# Patient Record
Sex: Male | Born: 1970 | Race: Black or African American | Hispanic: No | Marital: Single | Smoking: Former smoker
Health system: Southern US, Community
[De-identification: ages and names within clinical notes are randomized; demographics above are authoritative.]

## PROBLEM LIST (undated history)

## (undated) DIAGNOSIS — F319 Bipolar disorder, unspecified: Secondary | ICD-10-CM

---

## 2002-06-07 ENCOUNTER — Emergency Department (HOSPITAL_COMMUNITY): Admission: EM | Admit: 2002-06-07 | Discharge: 2002-06-07 | Payer: Self-pay | Admitting: Emergency Medicine

## 2003-06-12 ENCOUNTER — Emergency Department (HOSPITAL_COMMUNITY): Admission: EM | Admit: 2003-06-12 | Discharge: 2003-06-12 | Payer: Self-pay

## 2010-08-23 ENCOUNTER — Emergency Department (HOSPITAL_COMMUNITY)
Admission: EM | Admit: 2010-08-23 | Discharge: 2010-08-23 | Disposition: A | Discharge status: Home / Self Care | Payer: Self-pay | Attending: Emergency Medicine | Admitting: Emergency Medicine

## 2010-08-23 DIAGNOSIS — M542 Cervicalgia: Secondary | ICD-10-CM | POA: Insufficient documentation

## 2010-08-26 ENCOUNTER — Emergency Department (HOSPITAL_COMMUNITY)
Admission: EM | Admit: 2010-08-26 | Discharge: 2010-08-26 | Disposition: A | Discharge status: Home / Self Care | Payer: Self-pay | Attending: Emergency Medicine | Admitting: Emergency Medicine

## 2010-08-26 ENCOUNTER — Emergency Department (HOSPITAL_COMMUNITY): Payer: Self-pay

## 2010-08-26 DIAGNOSIS — M62838 Other muscle spasm: Secondary | ICD-10-CM | POA: Insufficient documentation

## 2010-08-26 DIAGNOSIS — M5030 Other cervical disc degeneration, unspecified cervical region: Secondary | ICD-10-CM | POA: Insufficient documentation

## 2010-08-26 DIAGNOSIS — M25519 Pain in unspecified shoulder: Secondary | ICD-10-CM | POA: Insufficient documentation

## 2010-08-26 DIAGNOSIS — M542 Cervicalgia: Secondary | ICD-10-CM | POA: Insufficient documentation

## 2010-08-26 DIAGNOSIS — M47812 Spondylosis without myelopathy or radiculopathy, cervical region: Secondary | ICD-10-CM | POA: Insufficient documentation

## 2011-10-19 IMAGING — CR DG CERVICAL SPINE COMPLETE 4+V
8 series · 8 of 8 positions shown · non-contrast
Comparison: None.

CLINICAL DATA: Right-sided neck pain.  No known injury.

CERVICAL SPINE - COMPLETE 4+ VIEW

[w c-spine lat]
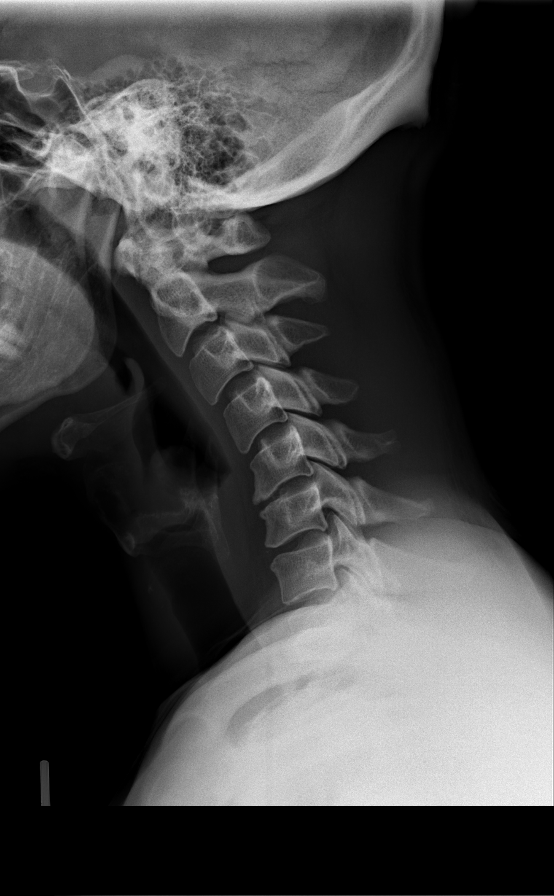

[w c-spine oblique (1 of 2)]
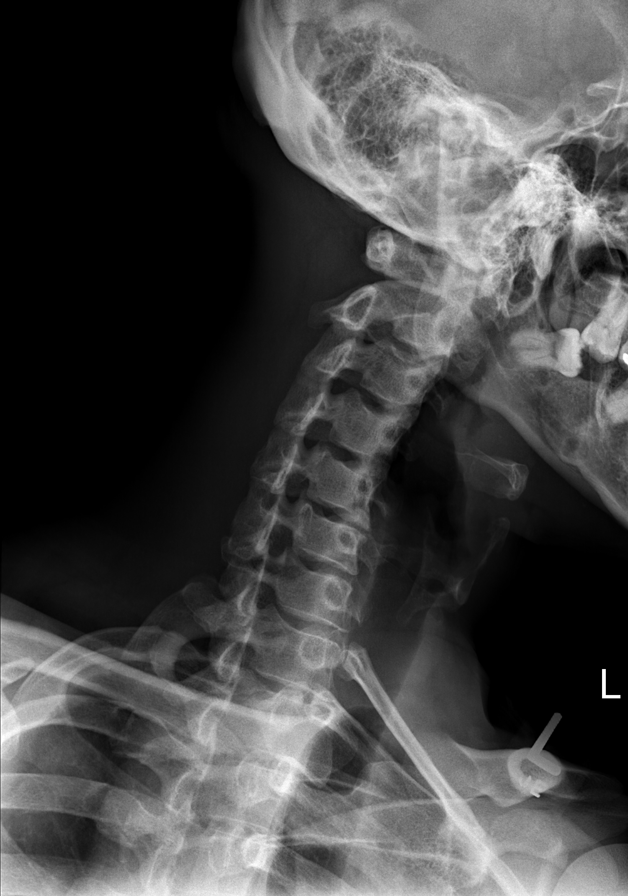

[w c-spine oblique (2 of 2)]
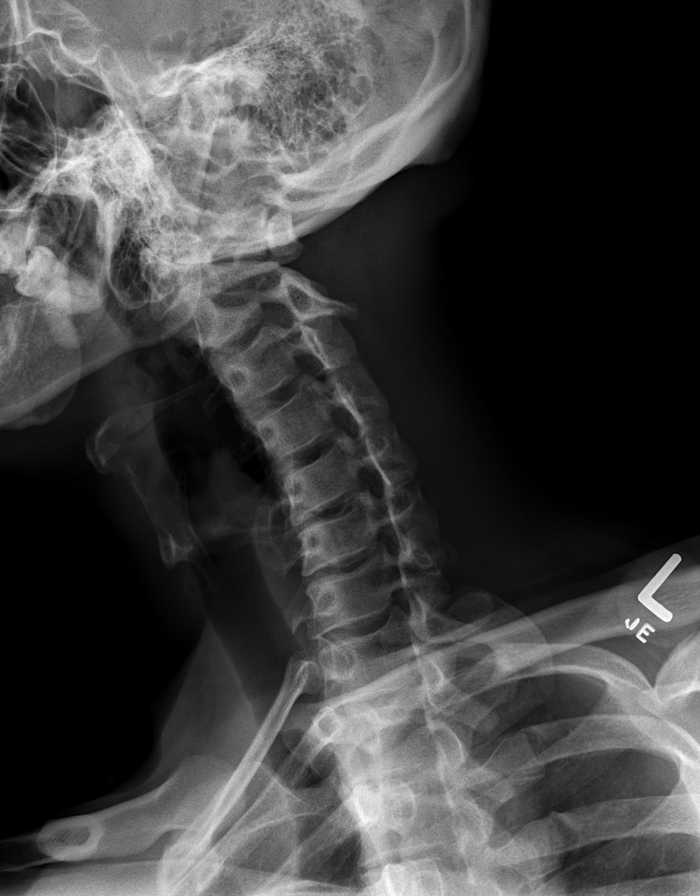

[w c-spine a.p.]
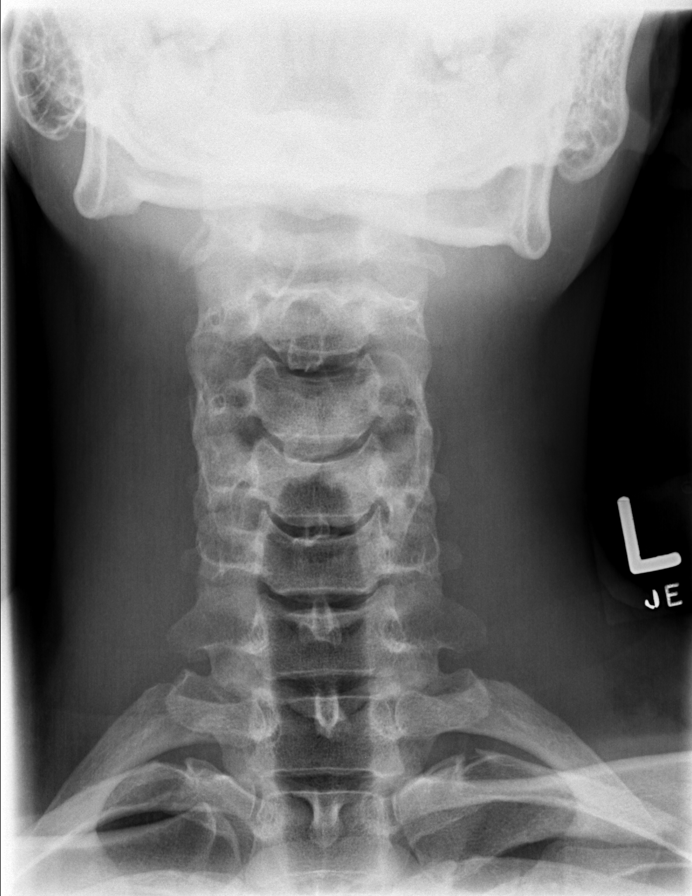

[w c-spine odontoid (1 of 2)]
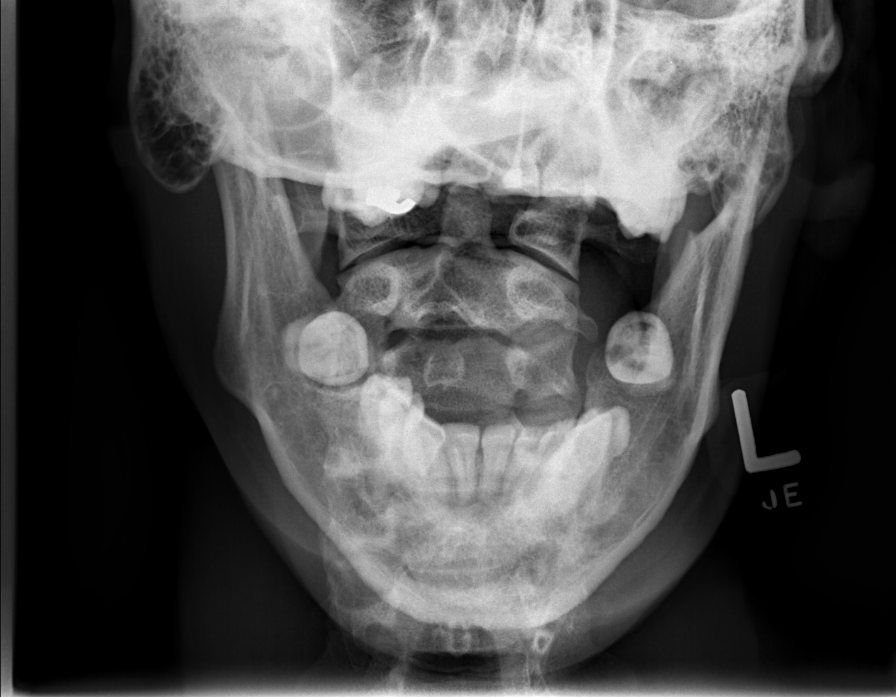

[w c-spine odontoid (2 of 2)]
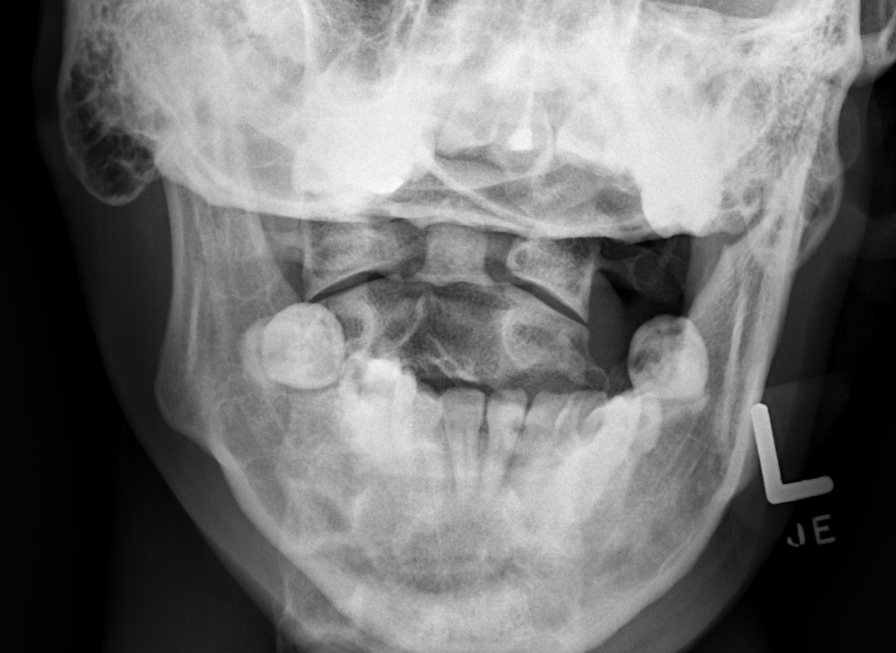

[w c-spine odontoid *]
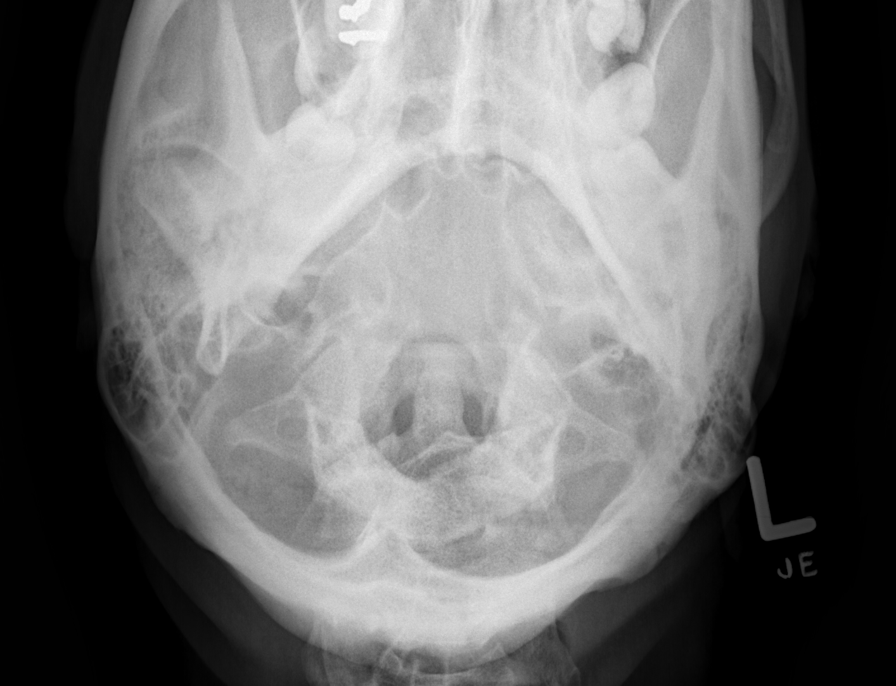

[w swimmers view *]
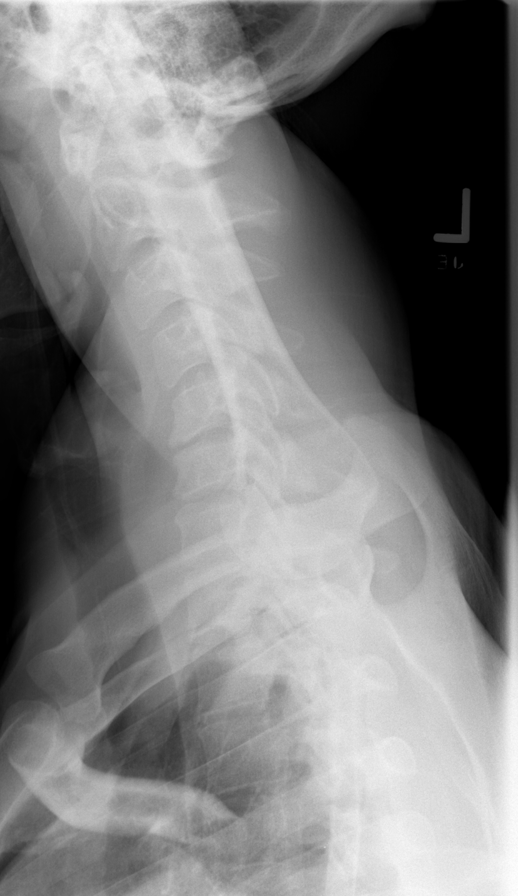

[8 of 8 positions shown; findings below may reference images not displayed]

FINDINGS: No evidence of cervical spine fracture, subluxation, or
prevertebral soft tissue swelling.

Mild to moderate degenerative disc disease is seen at C5-6.  No
significant facet arthropathy or other bone lesions are identified.
Marked cervical kyphosis is also seen, and may be due to muscle
spasm.
IMPRESSION: 1.  No evidence of circle spine fracture or subluxation.
2.  C5-6 degenerative disc disease.
3.  Marked cervical kyphosis, suspicious for muscle spasm; clinical
correlation is recommended.

## 2012-06-11 ENCOUNTER — Emergency Department (HOSPITAL_COMMUNITY)
Admission: EM | Admit: 2012-06-11 | Discharge: 2012-06-11 | Disposition: A | Discharge status: Home / Self Care | Payer: Self-pay | Attending: Emergency Medicine | Admitting: Emergency Medicine

## 2012-06-11 ENCOUNTER — Encounter (HOSPITAL_COMMUNITY): Payer: Self-pay

## 2012-06-11 DIAGNOSIS — K047 Periapical abscess without sinus: Secondary | ICD-10-CM | POA: Insufficient documentation

## 2012-06-11 DIAGNOSIS — F17200 Nicotine dependence, unspecified, uncomplicated: Secondary | ICD-10-CM | POA: Insufficient documentation

## 2012-06-11 MED ORDER — PENICILLIN V POTASSIUM 250 MG PO TABS
500.0000 mg | ORAL_TABLET | Freq: Once | ORAL | Status: AC
  Administered 2012-06-11: 500 mg via ORAL
  Filled 2012-06-11: qty 2

## 2012-06-11 MED ORDER — HYDROCODONE-ACETAMINOPHEN 5-325 MG PO TABS
1.0000 | ORAL_TABLET | Freq: Once | ORAL | Status: AC
  Administered 2012-06-11: 1 via ORAL
  Filled 2012-06-11: qty 1

## 2012-06-11 MED ORDER — HYDROCODONE-ACETAMINOPHEN 5-325 MG PO TABS: 1.0000 | ORAL_TABLET | ORAL | Status: AC | PRN

## 2012-06-11 MED ORDER — PENICILLIN V POTASSIUM 250 MG PO TABS: 500.0000 mg | ORAL_TABLET | Freq: Three times a day (TID) | ORAL | Status: AC

## 2012-06-11 NOTE — ED Notes (Signed)
Pain lt lower mandibular molar, causing pain to lt side of jaw and head.  Alert, Nad.

## 2012-06-11 NOTE — ED Notes (Signed)
Pt reports left lower toothache for "a while"

## 2012-06-12 NOTE — ED Provider Notes (Signed)
History     CSN: [id number]  Arrival date & time 06/11/12  1478   First MD Initiated Contact with Patient 06/11/12 1201      Chief Complaint  Patient presents with  . Dental Pain    (Consider location/radiation/quality/duration/timing/severity/associated sxs/prior treatment) HPI Comments: Jon Montgomery  presents with a several day history of dental pain and gingival swelling.   The patient has a history of decay in the tooth involved which has recently started to cause increased pain and now has swelling along the gumline.  There has been no fevers,  Chills, nausea or vomiting, also no complaint of difficulty swallowing,  Although chewing makes pain worse.  The patient has tried tylenol without relief of symptoms.      The history is provided by the patient.    History reviewed. No pertinent past medical history.  History reviewed. No pertinent past surgical history.  No family history on file.  History  Substance Use Topics  . Smoking status: Current Every Day Smoker    Types: Cigarettes  . Smokeless tobacco: Not on file  . Alcohol Use: No      Review of Systems  Constitutional: Negative for fever.  HENT: Positive for dental problem. Negative for sore throat, facial swelling, neck pain and neck stiffness.   Respiratory: Negative for shortness of breath.     Allergies  Review of patient's allergies indicates not on file.  Home Medications   Current Outpatient Rx  Name  Route  Sig  Dispense  Refill  . HYDROCODONE-ACETAMINOPHEN 5-325 MG PO TABS   Oral   Take 1 tablet by mouth every 4 (four) hours as needed for pain.   15 tablet   0   . PENICILLIN V POTASSIUM 250 MG PO TABS   Oral   Take 2 tablets (500 mg total) by mouth 3 (three) times daily.   60 tablet   0     BP 163/90  Pulse 74  Temp 98.3 F (36.8 C) (Oral)  Resp 18  Ht 5\' 10"  (1.778 m)  Wt 170 lb (77.111 kg)  BMI 24.39 kg/m2  SpO2 100%  Physical Exam  Constitutional: He is oriented to  person, place, and time. He appears well-developed and well-nourished. No distress.  HENT:  Head: Normocephalic and atraumatic. No trismus in the jaw.  Right Ear: Tympanic membrane and external ear normal.  Left Ear: Tympanic membrane and external ear normal.  Mouth/Throat: Oropharynx is clear and moist and mucous membranes are normal. No oral lesions. Dental abscesses present.    Eyes: Conjunctivae normal are normal.  Neck: Normal range of motion. Neck supple.  Cardiovascular: Normal rate and normal heart sounds.   Pulmonary/Chest: Effort normal.  Abdominal: He exhibits no distension.  Musculoskeletal: Normal range of motion.  Lymphadenopathy:    He has no cervical adenopathy.  Neurological: He is alert and oriented to person, place, and time.  Skin: Skin is warm and dry. No erythema.  Psychiatric: He has a normal mood and affect.    ED Course  Procedures (including critical care time)  Labs Reviewed - No data to display No results found.   1. Dental abscess       MDM  Pt prescribed hydrocodone, penicillin. Dental referrals given.  The patient appears reasonably screened and/or stabilized for discharge and I doubt any other medical condition or other [facility] requiring further screening, evaluation, or treatment in the ED at this time prior to discharge.  Burgess Amor, [place] 06/12/12 2106

## 2012-06-13 NOTE — ED Provider Notes (Signed)
Medical screening examination/treatment/procedure(s) were performed by non-physician practitioner and as supervising physician I was immediately available for consultation/collaboration.  Jones Skene, M.D.     Jones Skene, MD 06/13/12 1437

## 2017-03-03 ENCOUNTER — Encounter (HOSPITAL_COMMUNITY): Payer: Self-pay | Admitting: Emergency Medicine

## 2017-03-03 ENCOUNTER — Emergency Department (HOSPITAL_COMMUNITY)
Admission: EM | Admit: 2017-03-03 | Discharge: 2017-03-03 | Payer: Self-pay | Attending: Emergency Medicine | Admitting: Emergency Medicine

## 2017-03-03 DIAGNOSIS — Y999 Unspecified external cause status: Secondary | ICD-10-CM | POA: Insufficient documentation

## 2017-03-03 DIAGNOSIS — Y939 Activity, unspecified: Secondary | ICD-10-CM | POA: Insufficient documentation

## 2017-03-03 DIAGNOSIS — Y929 Unspecified place or not applicable: Secondary | ICD-10-CM | POA: Insufficient documentation

## 2017-03-03 DIAGNOSIS — S60812A Abrasion of left wrist, initial encounter: Secondary | ICD-10-CM | POA: Insufficient documentation

## 2017-03-03 DIAGNOSIS — T148XXA Other injury of unspecified body region, initial encounter: Secondary | ICD-10-CM

## 2017-03-03 DIAGNOSIS — S61411A Laceration without foreign body of right hand, initial encounter: Secondary | ICD-10-CM

## 2017-03-03 DIAGNOSIS — W268XXA Contact with other sharp object(s), not elsewhere classified, initial encounter: Secondary | ICD-10-CM | POA: Insufficient documentation

## 2017-03-03 DIAGNOSIS — F17210 Nicotine dependence, cigarettes, uncomplicated: Secondary | ICD-10-CM | POA: Insufficient documentation

## 2017-03-03 MED ORDER — HYDROGEN PEROXIDE 3 % EX SOLN
CUTANEOUS | Status: AC
  Administered 2017-03-03: 1
  Filled 2017-03-03: qty 473

## 2017-03-03 NOTE — Discharge Instructions (Signed)
Your wound was repaired with Dermabond. This will come off in about 5-7 days. Please do not pull on it. Please see your primary physician or return to the emergency department if any signs of infection.  [facility] for evaluation of the sutured wound of your wrist and elbow. It is important that she keep a dressing over the elbow because it has a drain in place.

## 2017-03-03 NOTE — ED Triage Notes (Signed)
[facility] [place] [facility] [place], pt reports he cut it on a fence.

## 2017-03-03 NOTE — ED Provider Notes (Signed)
[facility]-EMERGENCY DEPT Provider Note   CSN: [id number] Arrival date & time: 03/03/17  2227     History   Chief Complaint Chief Complaint  Patient presents with  . Laceration    HPI SHEAMUS HASTING is a 46 y.o. male.  Patient is a 46 year old male who presents to the emergency department in the custody of the [facility] with a laceration to the right hand. The patient states that he cut his hand on a fence earlier today. The police officers stated that they needed him evaluated before he could be taken into custody. It is of note that the patient has a laceration at his been recently sutured of the left wrist and also a laceration wound that has been repaired of the right elbow on with a drain still in place. The police officers asked that these be evaluated so that the patient could be cleared to go to jail. The patient states that he had a tetanus shot when he had the other cuts and lacerations.      History reviewed. No pertinent past medical history.  There are no active problems to display for this patient.   History reviewed. No pertinent surgical history.     Home Medications    Prior to Admission medications   Medication Sig Start Date End Date Taking? Authorizing Provider  HYDROcodone-acetaminophen (NORCO/VICODIN) 5-325 MG tablet TK 1 TABLET PO EVERY 6 HOURS PRN P 03/01/17   [provider]    Family History No family history on file.  Social History Social History  Substance Use Topics  . Smoking status: Current Every Day Smoker    Packs/day: 1.00    Types: Cigarettes  . Smokeless tobacco: Never Used  . Alcohol use No     Allergies   Patient has no known allergies.   Review of Systems Review of Systems  Constitutional: Negative for activity change.       All ROS Neg except as noted in HPI  HENT: Negative for nosebleeds.   Eyes: Negative for photophobia and discharge.  Respiratory: Negative for cough, shortness of breath and  wheezing.   Cardiovascular: Negative for chest pain and palpitations.  Gastrointestinal: Negative for abdominal pain and blood in stool.  Genitourinary: Negative for dysuria, frequency and hematuria.  Musculoskeletal: Negative for arthralgias, back pain and neck pain.  Skin: Positive for wound.  Neurological: Negative for dizziness, seizures and speech difficulty.  Psychiatric/Behavioral: Negative for confusion and hallucinations.     Physical Exam Updated Vital Signs BP (!) 142/105   Pulse 91   Temp 98.6 F (37 C) (Oral)   Resp 16   Ht 5\' 9"  (1.753 m)   Wt 77.1 kg (170 lb)   SpO2 100%   BMI 25.10 kg/m   Physical Exam  Constitutional: He is oriented to person, place, and time. He appears well-developed and well-nourished.  Non-toxic appearance.  HENT:  Head: Normocephalic.  Right Ear: Tympanic membrane and external ear normal.  Left Ear: Tympanic membrane and external ear normal.  Eyes: Pupils are equal, round, and reactive to light. EOM and lids are normal.  Neck: Normal range of motion. Neck supple. Carotid bruit is not present.  Cardiovascular: Normal rate, regular rhythm, normal heart sounds, intact distal pulses and normal pulses.   Pulmonary/Chest: Breath sounds normal. No respiratory distress.  Abdominal: Soft. Bowel sounds are normal. There is no tenderness. There is no guarding.  Musculoskeletal: Normal range of motion.  Patient has a sutured wound of the right  elbow with a drain in place. There no red streaks appreciated. Is no active drainage noted at this time. No increased redness of the elbow. There is good range of motion of the right shoulder, wrists, and fingers. There is a 2.2 cm laceration at the palmar surface at the base of the middle finger. There is no tendon or bone involvement.  There is a sutured wound of the left wrist. The sutures remain intact. There is an abrasion of the radial aspect of the left wrist that also occurred as the patient got hurt on a  fence today. There is full range of motion of the left shoulder, elbow, wrist, and fingers.  Lymphadenopathy:       Head (right side): No submandibular adenopathy present.       Head (left side): No submandibular adenopathy present.    He has no cervical adenopathy.  Neurological: He is alert and oriented to person, place, and time. He has normal strength. No cranial nerve deficit or sensory deficit.  Skin: Skin is warm and dry.  Psychiatric: He has a normal mood and affect. His speech is normal.  Nursing note and vitals reviewed.    ED Treatments / Results  Labs (all labs ordered are listed, but only abnormal results are displayed) Labs Reviewed - No data to display  EKG  EKG Interpretation None       Radiology No results found.  Procedures .Marland Kitchen.Laceration Repair Date/Time: 03/03/2017 11:38 PM Performed by: Ivery QualeBRYANT, Yazlin Ekblad Authorized by: Ivery QualeBRYANT, Veona Bittman   Consent:    Consent obtained:  Verbal   Consent given by:  Patient   Risks discussed:  Infection, poor cosmetic result and poor wound healing Anesthesia (see MAR for exact dosages):    Anesthesia method:  None Laceration details:    Location:  Hand   Hand location:  R palm   Length (cm):  2.2 Repair type:    Repair type:  Simple Pre-procedure details:    Preparation:  Patient was prepped and draped in usual sterile fashion Exploration:    Wound exploration: wound explored through full range of motion   Treatment:    Area cleansed with:  Soap and water   Amount of cleaning:  Standard   Irrigation solution:  Sterile saline Skin repair:    Repair method:  Tissue adhesive Approximation:    Approximation:  Close Post-procedure details:    Dressing:  Open (no dressing)   Patient tolerance of procedure:  Tolerated well, no immediate complications   (including critical care time)  Medications Ordered in ED Medications  hydrogen peroxide 3 % external solution (1 application  Given 03/03/17 2306)     Initial  Impression / Assessment and Plan / ED Course  I have reviewed the triage vital signs and the nursing notes.  Pertinent labs & imaging results that were available during my care of the patient were reviewed by me and considered in my medical decision making (see chart for details).       Final Clinical Impressions(s) / ED Diagnoses MDM Patient's blood pressure is elevated, otherwise the vital signs within normal limits. There is a shallow laceration of the palmar surface of the right hand. This was repaired with Dermabond. I discussed with the patient in need to return if any signs of infection or other problems. The patient is in agreement with this plan.  The patient has a sutured laceration with a drain in the right elbow and he has a sutured laceration of the left wrist.  He states he is scheduled to see the physicians at the [facility] Emergency Dept in the next few days.   Final diagnoses:  Laceration of right hand without foreign body, initial encounter  Abrasion    New Prescriptions New Prescriptions   No medications on file     Ivery Quale, Cordelia Poche 03/03/17 2345    Devoria Albe, MD 03/04/17 0430

## 2020-03-12 ENCOUNTER — Other Ambulatory Visit: Payer: Self-pay

## 2020-03-12 ENCOUNTER — Encounter (HOSPITAL_COMMUNITY): Payer: Self-pay

## 2020-03-12 ENCOUNTER — Emergency Department (HOSPITAL_COMMUNITY)
Admission: EM | Admit: 2020-03-12 | Discharge: 2020-03-12 | Disposition: A | Discharge status: Home / Self Care | Payer: Medicaid Other | Attending: Emergency Medicine | Admitting: Emergency Medicine

## 2020-03-12 DIAGNOSIS — H9390 Unspecified disorder of ear, unspecified ear: Secondary | ICD-10-CM | POA: Diagnosis present

## 2020-03-12 DIAGNOSIS — H60391 Other infective otitis externa, right ear: Secondary | ICD-10-CM | POA: Diagnosis not present

## 2020-03-12 DIAGNOSIS — Z87891 Personal history of nicotine dependence: Secondary | ICD-10-CM | POA: Diagnosis not present

## 2020-03-12 HISTORY — DX: Bipolar disorder, unspecified: F31.9

## 2020-03-12 MED ORDER — CIPROFLOXACIN-DEXAMETHASONE 0.3-0.1 % OT SUSP: 4.0000 [drp] | Freq: Two times a day (BID) | OTIC | 0 refills | Status: AC

## 2020-03-12 NOTE — ED Provider Notes (Signed)
[facility] EMERGENCY DEPARTMENT Provider Note   CSN: [id number] Arrival date & time: 03/12/20  1030     History Chief Complaint  Patient presents with  . Ear Problem    Jon Montgomery is a 49 y.o. male.  49 year old male presents with complaint of right ear pain x 2 days. Denies trauma, drainage. Ear is tender to the touch. No other complaints or concerns.         Past Medical History:  Diagnosis Date  . Bipolar 1 disorder (HCC)     There are no problems to display for this patient.   History reviewed. No pertinent surgical history.     No family history on file.  Social History   Tobacco Use  . Smoking status: Former Smoker    Packs/day: 1.00    Types: Cigarettes  . Smokeless tobacco: Never Used  Substance Use Topics  . Alcohol use: No  . Drug use: No    Home Medications Prior to Admission medications   Medication Sig Start Date End Date Taking? Authorizing Provider  ciprofloxacin-dexamethasone (CIPRODEX) OTIC suspension Place 4 drops into the right ear 2 (two) times daily. 03/12/20   Jeannie Fend, PA-C  HYDROcodone-acetaminophen (NORCO/VICODIN) 5-325 MG tablet TK 1 TABLET PO EVERY 6 HOURS PRN P 03/01/17   [provider]    Allergies    Patient has no known allergies.  Review of Systems   Review of Systems  Constitutional: Negative for fever.  HENT: Positive for ear pain. Negative for congestion, ear discharge and facial swelling.   Respiratory: Negative for cough.   Skin: Negative for rash and wound.  Allergic/Immunologic: Negative for immunocompromised state.  Neurological: Negative for headaches.    Physical Exam Updated Vital Signs BP (!) 134/99 (BP Location: Right Arm)   Pulse 88   Temp 98.9 F (37.2 C) (Oral)   Resp 18   Wt 97.1 kg   SpO2 100%   BMI 31.60 kg/m   Physical Exam Vitals and nursing note reviewed.  Constitutional:      General: He is not in acute distress.    Appearance: He is well-developed. He is not  diaphoretic.  HENT:     Head: Normocephalic and atraumatic.     Right Ear: Swelling and tenderness present. No mastoid tenderness.     Left Ear: Tympanic membrane and ear canal normal.     Ears:     Comments: Unable to visualize right TM secondary to right canal swelling Pulmonary:     Effort: Pulmonary effort is normal.  Lymphadenopathy:     Cervical: No cervical adenopathy.  Skin:    General: Skin is warm and dry.     Findings: No erythema or rash.  Neurological:     Mental Status: He is alert and oriented to person, place, and time.  Psychiatric:        Behavior: Behavior normal.     ED Results / Procedures / Treatments   Labs (all labs ordered are listed, but only abnormal results are displayed) Labs Reviewed - No data to display  EKG None  Radiology No results found.  Procedures Procedures (including critical care time)  Medications Ordered in ED Medications - No data to display  ED Course  I have reviewed the triage vital signs and the nursing notes.  Pertinent labs & imaging results that were available during my care of the patient were reviewed by me and considered in my medical decision making (see chart for  details).  Clinical Course as of Mar 12 1225  Fri Mar 12, 2020  1226 48yo male with right ear pain x 2 days. On exam, has right canal swelling and tenderness, unable to visualize TM secondary to canal swelling. Plan is to treat for OE with ciprodex.    [LM]    Clinical Course User Index [LM] Alden Hipp   MDM Rules/Calculators/A&P                          Final Clinical Impression(s) / ED Diagnoses Final diagnoses:  Other infective acute otitis externa of right ear    Rx / DC Orders ED Discharge Orders         Ordered    ciprofloxacin-dexamethasone (CIPRODEX) OTIC suspension  2 times daily        03/12/20 1218           Jeannie Fend, PA-C 03/12/20 1226    Bethann Berkshire, MD 03/13/20 1444

## 2020-03-12 NOTE — ED Triage Notes (Signed)
Pt presents to ED with complaints of right ear pain x 2 days, denies fever.

## 2021-04-28 ENCOUNTER — Encounter (INDEPENDENT_AMBULATORY_CARE_PROVIDER_SITE_OTHER): Payer: Self-pay | Admitting: *Deleted

## 2021-08-01 ENCOUNTER — Encounter: Payer: Self-pay | Admitting: Internal Medicine

## 2021-09-26 ENCOUNTER — Ambulatory Visit: Payer: Self-pay

## 2021-09-26 ENCOUNTER — Other Ambulatory Visit: Payer: Self-pay

## 2021-09-26 ENCOUNTER — Encounter: Payer: Self-pay | Admitting: Internal Medicine

## 2021-09-26 ENCOUNTER — Telehealth: Payer: Self-pay | Admitting: *Deleted

## 2021-09-26 NOTE — Telephone Encounter (Signed)
Pt was scheduled for 11:00 nurse visit today.  Left 2 messages by voice mail on both numbers listed but no response. ?

## 2021-10-03 ENCOUNTER — Encounter (INDEPENDENT_AMBULATORY_CARE_PROVIDER_SITE_OTHER): Payer: Self-pay | Admitting: *Deleted

## 2022-07-20 ENCOUNTER — Encounter: Payer: Self-pay | Admitting: *Deleted

## 2023-01-01 ENCOUNTER — Encounter: Payer: Self-pay | Admitting: *Deleted
